# Patient Record
Sex: Male | Born: 1970 | Race: White | Hispanic: No | Marital: Married | Smoking: Former smoker
Health system: Southern US, Community
[De-identification: ages and names within clinical notes are randomized; demographics above are authoritative.]

## PROBLEM LIST (undated history)

## (undated) DIAGNOSIS — C189 Malignant neoplasm of colon, unspecified: Secondary | ICD-10-CM

## (undated) HISTORY — PX: HERNIA REPAIR: SHX51

## (undated) HISTORY — PX: COLON RESECTION: SHX5231

## (undated) HISTORY — PX: ANKLE SURGERY: SHX546

## (undated) HISTORY — PX: APPENDECTOMY: SHX54

## (undated) HISTORY — PX: WRIST FUSION: SHX839

---

## 2020-09-14 ENCOUNTER — Encounter (HOSPITAL_BASED_OUTPATIENT_CLINIC_OR_DEPARTMENT_OTHER): Payer: Self-pay | Admitting: *Deleted

## 2020-09-14 ENCOUNTER — Other Ambulatory Visit: Payer: Self-pay

## 2020-09-14 ENCOUNTER — Emergency Department (HOSPITAL_BASED_OUTPATIENT_CLINIC_OR_DEPARTMENT_OTHER): Payer: BC Managed Care – PPO

## 2020-09-14 ENCOUNTER — Emergency Department (HOSPITAL_BASED_OUTPATIENT_CLINIC_OR_DEPARTMENT_OTHER)
Admission: EM | Admit: 2020-09-14 | Discharge: 2020-09-14 | Disposition: A | Discharge status: Home / Self Care | Payer: BC Managed Care – PPO | Attending: Emergency Medicine | Admitting: Emergency Medicine

## 2020-09-14 DIAGNOSIS — Z87891 Personal history of nicotine dependence: Secondary | ICD-10-CM | POA: Insufficient documentation

## 2020-09-14 DIAGNOSIS — J1282 Pneumonia due to coronavirus disease 2019: Secondary | ICD-10-CM | POA: Diagnosis not present

## 2020-09-14 DIAGNOSIS — Z85038 Personal history of other malignant neoplasm of large intestine: Secondary | ICD-10-CM | POA: Insufficient documentation

## 2020-09-14 DIAGNOSIS — U071 COVID-19: Secondary | ICD-10-CM | POA: Diagnosis not present

## 2020-09-14 DIAGNOSIS — R05 Cough: Secondary | ICD-10-CM | POA: Diagnosis present

## 2020-09-14 HISTORY — DX: Malignant neoplasm of colon, unspecified: C18.9

## 2020-09-14 MED ORDER — ALBUTEROL SULFATE HFA 108 (90 BASE) MCG/ACT IN AERS: 1.0000 | INHALATION_SPRAY | Freq: Four times a day (QID) | RESPIRATORY_TRACT | 0 refills | Status: AC | PRN

## 2020-09-14 MED ORDER — DOXYCYCLINE HYCLATE 100 MG PO CAPS: 100.0000 mg | ORAL_CAPSULE | Freq: Two times a day (BID) | ORAL | 0 refills | Status: AC

## 2020-09-14 NOTE — ED Triage Notes (Signed)
C/o cough x 3 days , covid symptoms x 1 week

## 2020-09-14 NOTE — Discharge Instructions (Signed)
As we discussed, I have referred you to the monoclonal antibody infusion clinic.  If you are eligible, they will contact you and discuss possibilities.  I have provided you an albuterol inhaler that she can use.  As we discussed, there is a small area of pneumonia on the left side of the chest.  This could be related to Covid.  It may not respond to antibiotics.  I have prescribed you an antibiotic that you can use if you do not have any improvement in the next few days.  I have also provided you referral to Covid care clinic that you follow-up with.  Return emergency department for any fevers or any other worsening concerning symptoms.

## 2020-09-14 NOTE — ED Provider Notes (Signed)
[facility] EMERGENCY DEPARTMENT Provider Note   CSN: [id number] Arrival date & time: 09/14/20  1341     History Chief Complaint  Patient presents with   Cough    covid +    Jacob Wolfe is a 49 y.o. male with PMH/o Colon Cancer (remission) who presents for evaluation of persistent fever, cough.  He reports that he was diagnosed with Covid about 5 days ago.  He thinks he has been symptomatic for about 7 days.  He states he came today because he continues to spike a fever.  He reports that his fever spikes up as high as to 102.  He has been taking Tylenol ibuprofen but he is concerned because it keeps coming back he is continue to have cough.  Sometimes it is productive.  He does not feel like he has had trouble breathing.  He states he was concerned because his fever Spiking.  He does not smoke.  He denies any history of COPD or asthma.  He has history of colon cancer but states he is in remission. He is unvaccinated.   The history is provided by the patient.       Past Medical History:  Diagnosis Date   Colon cancer ([place])     There are no problems to display for this patient.   Past Surgical History:  Procedure Laterality Date   ANKLE SURGERY     APPENDECTOMY     COLON RESECTION     HERNIA REPAIR     WRIST FUSION         No family history on file.  Social History   Tobacco Use   Smoking status: Former Smoker  Substance Use Topics   Alcohol use: Not Currently   Drug use: Not Currently    Home Medications Prior to Admission medications   Medication Sig Start Date End Date Taking? Authorizing Provider  albuterol (VENTOLIN HFA) 108 (90 Base) MCG/ACT inhaler Inhale 1-2 puffs into the lungs every 6 (six) hours as needed for wheezing or shortness of breath. 09/14/20   [place]idence Lanius A, PA-C  doxycycline (VIBRAMYCIN) 100 MG capsule Take 1 capsule (100 mg total) by mouth 2 (two) times daily for 7 days. 09/14/20 09/21/20  Volanda Napoleon, PA-C     Allergies    Patient has no known allergies.  Review of Systems   Review of Systems  Constitutional: Positive for fever.  Respiratory: Positive for cough. Negative for shortness of breath.   Cardiovascular: Negative for chest pain.  Gastrointestinal: Negative for abdominal pain, diarrhea, nausea and vomiting.  All other systems reviewed and are negative.   Physical Exam Updated Vital Signs BP 128/90    Pulse (!) 108    Temp 99.5 F (37.5 C) (Oral)    Resp 18    Ht 6' (1.829 m)    Wt 109.8 kg    SpO2 98%    BMI 32.82 kg/m   Physical Exam Vitals and nursing note reviewed.  Constitutional:      Appearance: Normal appearance. He is well-developed.     Comments: NAD   HENT:     Head: Normocephalic and atraumatic.  Eyes:     General: Lids are normal.     Conjunctiva/sclera: Conjunctivae normal.     Pupils: Pupils are equal, round, and reactive to light.  Cardiovascular:     Rate and Rhythm: Normal rate and regular rhythm.     Pulses: Normal pulses.     Heart sounds: Normal heart  sounds. No murmur heard.  No friction rub. No gallop.   Pulmonary:     Effort: Pulmonary effort is normal.     Breath sounds: Normal breath sounds.     Comments: Lungs clear to auscultation bilaterally.  Symmetric chest rise.  No wheezing, rales, rhonchi. Abdominal:     Palpations: Abdomen is soft. Abdomen is not rigid.     Tenderness: There is no abdominal tenderness. There is no guarding.     Comments: Abdomen is soft, non-distended, non-tender. No rigidity, No guarding. No peritoneal signs.  Musculoskeletal:        General: Normal range of motion.     Cervical back: Full passive range of motion without pain.  Skin:    General: Skin is warm and dry.     Capillary Refill: Capillary refill takes less than 2 seconds.  Neurological:     Mental Status: He is alert and oriented to person, place, and time.  Psychiatric:        Speech: Speech normal.     ED Results / Procedures / Treatments    Labs (all labs ordered are listed, but only abnormal results are displayed) Labs Reviewed - No data to display  EKG None  Radiology DG Chest Portable 1 View  Result Date: 09/14/2020 CLINICAL DATA:  Cough, COVID positive EXAM: PORTABLE CHEST 1 VIEW COMPARISON:  None. FINDINGS: Subtle ill-defined airspace opacity overlying the left mid lung. Otherwise, the lungs are clear. Cardiac and mediastinal contours are within normal limits. No pleural effusion or pneumothorax. No acute osseous abnormality. IMPRESSION: Subtle, ill-defined airspace opacity overlying the left mid lung which may represent a region of focal infection/inflammation. Atypical/viral pneumonia is a possibility given known COVID positivity. Electronically Signed   By: Jacqulynn Cadet M.D.   On: 09/14/2020 14:27    Procedures Procedures (including critical care time)  Medications Ordered in ED Medications - No data to display  ED Course  I have reviewed the triage vital signs and the nursing notes.  Pertinent labs & imaging results that were available during my care of the patient were reviewed by me and considered in my medical decision making (see chart for details).    MDM Rules/Calculators/A&P                          49 year old male who was diagnosed with Covid a week ago presents for evaluation of persistent cystic fever and cough.  He is unvaccinated.  He states that his fever has continued and will sometimes get up to 102.  He has been taking Tylenol and ibuprofen.  He states that he continues to have a cough.  Sometimes it is productive.  He does not feel like he is having difficulty breathing.  He used to smoke but does not smoke anymore.  On initially arrival, he is afebrile, nontoxic-appearing.  Slightly tachycardic but is not tachycardic on my exam.  No evidence of respiratory distress.  Chest x-ray ordered at triage.  Chest x-ray shows possible subtle ill-defined airspace opacity overlying the left midlung  which could represent focal infection/inflammation.  I discussed results with patient.  I discussed with him that this could be pneumonia from Covid which would be viral in nature.  He is very concerned because he continues to have persistent fever and is concerned about being treated for possible pneumonia.  I discussed with him that given the viral nature of this, it may not respond to antibiotics.  After engaging  in shared decision making and extensive discussion, we agreed to give him a prescription for doxycycline that he can wait a few days and if he has no improvement can start.  Additionally, I have referred patient to monoclonal antibody infusion clinic. At this time, patient exhibits no emergent life-threatening condition that require further evaluation in ED. Patient had ample opportunity for questions and discussion. All patient's questions were answered with full understanding. Strict return precautions discussed. Patient expresses understanding and agreement to plan.   Jacob Wolfe was evaluated in Emergency Department on 09/14/2020 for the symptoms described in the history of present illness. He was evaluated in the context of the global COVID-19 pandemic, which necessitated consideration that the patient might be at risk for infection with the SARS-CoV-2 virus that causes COVID-19. Institutional protocols and algorithms that pertain to the evaluation of patients at risk for COVID-19 are in a state of rapid change based on information released by regulatory bodies including the CDC and federal and state organizations. These policies and algorithms were followed during the patient's care in the ED.  Portions of this note were generated with [employer]. Dictation errors may occur despite best attempts at proofreading.  Final Clinical Impression(s) / ED Diagnoses Final diagnoses:  COVID-19  Pneumonia due to COVID-19 virus    Rx / DC Orders ED Discharge Orders          Ordered    doxycycline (VIBRAMYCIN) 100 MG capsule  2 times daily        09/14/20 1528    albuterol (VENTOLIN HFA) 108 (90 Base) MCG/ACT inhaler  Every 6 hours PRN        09/14/20 1528           Desma Mcgregor 09/14/20 1623    Drenda Freeze, MD 09/14/20 [phone]

## 2020-09-15 ENCOUNTER — Ambulatory Visit (HOSPITAL_COMMUNITY)
Admission: RE | Admit: 2020-09-15 | Discharge: 2020-09-15 | Disposition: A | Discharge status: Home / Self Care | Payer: BC Managed Care – PPO | Attending: Family Medicine | Admitting: Family Medicine

## 2020-09-15 ENCOUNTER — Other Ambulatory Visit: Payer: Self-pay | Admitting: Internal Medicine

## 2020-09-15 DIAGNOSIS — U071 COVID-19: Secondary | ICD-10-CM | POA: Insufficient documentation

## 2020-09-15 DIAGNOSIS — Z6832 Body mass index (BMI) 32.0-32.9, adult: Secondary | ICD-10-CM

## 2020-09-15 MED ORDER — ALBUTEROL SULFATE HFA 108 (90 BASE) MCG/ACT IN AERS: 2.0000 | INHALATION_SPRAY | Freq: Once | RESPIRATORY_TRACT | Status: DC | PRN

## 2020-09-15 MED ORDER — DIPHENHYDRAMINE HCL 50 MG/ML IJ SOLN: 50.0000 mg | Freq: Once | INTRAMUSCULAR | Status: DC | PRN

## 2020-09-15 MED ORDER — EPINEPHRINE 0.3 MG/0.3ML IJ SOAJ: 0.3000 mg | Freq: Once | INTRAMUSCULAR | Status: DC | PRN

## 2020-09-15 MED ORDER — SODIUM CHLORIDE 0.9 % IV SOLN
1200.0000 mg | Freq: Once | INTRAVENOUS | Status: AC
  Administered 2020-09-15: 1200 mg via INTRAVENOUS

## 2020-09-15 MED ORDER — FAMOTIDINE IN NACL 20-0.9 MG/50ML-% IV SOLN: 20.0000 mg | Freq: Once | INTRAVENOUS | Status: DC | PRN

## 2020-09-15 MED ORDER — SODIUM CHLORIDE 0.9 % IV SOLN: INTRAVENOUS | Status: DC | PRN

## 2020-09-15 MED ORDER — METHYLPREDNISOLONE SODIUM SUCC 125 MG IJ SOLR: 125.0000 mg | Freq: Once | INTRAMUSCULAR | Status: DC | PRN

## 2020-09-15 NOTE — Progress Notes (Signed)
  Diagnosis: COVID-19  Physician: Asencion Noble, MD  Procedure: Covid Infusion Clinic Med: casirivimab\imdevimab infusion - Provided patient with casirivimab\imdevimab fact sheet for patients, parents and caregivers prior to infusion.  Complications: No immediate complications noted.  Discharge: Discharged home   Jacob Wolfe 09/15/2020

## 2020-09-15 NOTE — Progress Notes (Signed)
I connected by phone with Jacob Wolfe on 09/15/2020 at 9:38 AM to discuss the potential use of a new treatment for mild to moderate COVID-19 viral infection in non-hospitalized patients.  This patient is a 49 y.o. male that meets the FDA criteria for Emergency Use Authorization of COVID monoclonal antibody casirivimab/imdevimab or bamlanivimab/eteseviamb.  Has a (+) direct SARS-CoV-2 viral test result  Has mild or moderate COVID-19   Is NOT hospitalized due to COVID-19  Is within 10 days of symptom onset  Has at least one of the high risk factor(s) for progression to severe COVID-19 and/or hospitalization as defined in EUA.  Specific high risk criteria : BMI > 25   I have spoken and communicated the following to the patient or parent/caregiver regarding COVID monoclonal antibody treatment:  1. FDA has authorized the emergency use for the treatment of mild to moderate COVID-19 in adults and pediatric patients with positive results of direct SARS-CoV-2 viral testing who are 46 years of age and older weighing at least 40 kg, and who are at high risk for progressing to severe COVID-19 and/or hospitalization.  2. The significant known and potential risks and benefits of COVID monoclonal antibody, and the extent to which such potential risks and benefits are unknown.  3. Information on available alternative treatments and the risks and benefits of those alternatives, including clinical trials.  4. Patients treated with COVID monoclonal antibody should continue to self-isolate and use infection control measures (e.g., wear mask, isolate, social distance, avoid sharing personal items, clean and disinfect "high touch" surfaces, and frequent handwashing) according to CDC guidelines.   5. The patient or parent/caregiver has the option to accept or refuse COVID monoclonal antibody treatment.  After reviewing this information with the patient, the patient has agreed to receive one of the available  covid 19 monoclonal antibodies and will be provided an appropriate fact sheet prior to infusion.  Alan Ripper, [place]

## 2020-09-15 NOTE — Discharge Instructions (Signed)

## 2022-04-10 ENCOUNTER — Encounter (HOSPITAL_BASED_OUTPATIENT_CLINIC_OR_DEPARTMENT_OTHER): Payer: Self-pay | Admitting: Emergency Medicine

## 2022-04-10 ENCOUNTER — Emergency Department (HOSPITAL_BASED_OUTPATIENT_CLINIC_OR_DEPARTMENT_OTHER): Payer: BC Managed Care – PPO

## 2022-04-10 ENCOUNTER — Emergency Department (HOSPITAL_BASED_OUTPATIENT_CLINIC_OR_DEPARTMENT_OTHER)
Admission: EM | Admit: 2022-04-10 | Discharge: 2022-04-10 | Disposition: A | Discharge status: Home / Self Care | Payer: BC Managed Care – PPO | Attending: Emergency Medicine | Admitting: Emergency Medicine

## 2022-04-10 ENCOUNTER — Other Ambulatory Visit: Payer: Self-pay

## 2022-04-10 DIAGNOSIS — M79671 Pain in right foot: Secondary | ICD-10-CM | POA: Diagnosis not present

## 2022-04-10 DIAGNOSIS — M25571 Pain in right ankle and joints of right foot: Secondary | ICD-10-CM | POA: Diagnosis present

## 2022-04-10 MED ORDER — CYCLOBENZAPRINE HCL 10 MG PO TABS: 10.0000 mg | ORAL_TABLET | Freq: Two times a day (BID) | ORAL | 0 refills | Status: AC | PRN

## 2022-04-10 NOTE — ED Notes (Signed)
Pt care taken, complaining of pain in the rt leg. ?

## 2022-04-10 NOTE — ED Provider Notes (Signed)
?[facility] EMERGENCY DEPARTMENT ?Provider Note ? ? ?CSN: [id number] ?Arrival date & time: 04/10/22  1901 ? ?  ? ?History ? ?Chief Complaint  ?Patient presents with  ? Ankle Pain  ? ? ?Jacob Wolfe is a 51 y.o. male. ? ?The history is provided by the patient and medical records. No language interpreter was used.  ?Ankle Pain ?Location:  Ankle ?Time since incident:  3 weeks ?Injury: no   ?Ankle location:  R ankle ?Pain details:  ?  Quality:  Aching ?  Radiates to: r calf. ?  Severity:  Severe ?  Onset quality:  Gradual ?  Timing:  Constant ?  Progression:  Waxing and waning ?Chronicity:  New ?Foreign body present:  No foreign bodies ?Tetanus status:  Unknown ?Prior injury to area:  Yes ?Relieved by:  Nothing ?Worsened by:  Bearing weight ?Ineffective treatments:  None tried ?Associated symptoms: swelling (subjhective per pt)   ?Associated symptoms: no back pain, no decreased ROM, no fatigue, no fever, no itching, no muscle weakness, no neck pain, no numbness, no stiffness and no tingling   ? ?  ? ?Home Medications ?Prior to Admission medications   ?Medication Sig Start Date End Date Taking? Authorizing Provider  ?albuterol (VENTOLIN HFA) 108 (90 Base) MCG/ACT inhaler Inhale 1-2 puffs into the lungs every 6 (six) hours as needed for wheezing or shortness of breath. 09/14/20   Volanda Napoleon, PA-C  ?   ? ?Allergies    ?Patient has no known allergies.   ? ?Review of Systems   ?Review of Systems  ?Constitutional:  Negative for chills, fatigue and fever.  ?HENT:  Negative for congestion.   ?Eyes:  Negative for visual disturbance.  ?Respiratory:  Negative for cough, chest tightness, shortness of breath and wheezing.   ?Cardiovascular:  Negative for chest pain, palpitations and leg swelling.  ?Gastrointestinal:  Negative for abdominal pain, constipation, diarrhea, nausea and vomiting.  ?Genitourinary:  Negative for flank pain and frequency.  ?Musculoskeletal:  Negative for back pain, neck pain, neck stiffness  and stiffness.  ?Skin:  Negative for itching, rash and wound.  ?Neurological:  Negative for headaches.  ?Psychiatric/Behavioral:  Negative for agitation and confusion.   ?All other systems reviewed and are negative. ? ?Physical Exam ?Updated Vital Signs ?BP 126/90   Pulse 79   Temp 97.7 ?F (36.5 ?C) (Oral)   Resp 16   Wt 108.9 kg   SpO2 98%   BMI 32.55 kg/m?  ?Physical Exam ?Vitals and nursing note reviewed.  ?Constitutional:   ?   General: He is not in acute distress. ?   Appearance: He is well-developed. He is not ill-appearing, toxic-appearing or diaphoretic.  ?HENT:  ?   Head: Normocephalic and atraumatic.  ?   Mouth/Throat:  ?   Mouth: Mucous membranes are moist.  ?Eyes:  ?   Conjunctiva/sclera: Conjunctivae normal.  ?Cardiovascular:  ?   Rate and Rhythm: Normal rate and regular rhythm.  ?   Heart sounds: No murmur heard. ?Pulmonary:  ?   Effort: Pulmonary effort is normal. No respiratory distress.  ?   Breath sounds: Normal breath sounds. No wheezing, rhonchi or rales.  ?Chest:  ?   Chest wall: No tenderness.  ?Abdominal:  ?   General: Abdomen is flat.  ?   Palpations: Abdomen is soft.  ?   Tenderness: There is no abdominal tenderness. There is no guarding or rebound.  ?Musculoskeletal:     ?   General: Tenderness present. No swelling or  signs of injury.  ?   Cervical back: Neck supple.  ?   Right lower leg: No edema.  ?   Left lower leg: No edema.  ?   Right ankle: No swelling, deformity or lacerations. Tenderness present. Anterior drawer test negative. Normal pulse.  ?   Right Achilles Tendon: Tenderness present.  ?   Comments: Surgical scar on left ankle.  Tenderness across the ankle without significant swelling, erythema, induration, crepitance, or skin changes  ?Skin: ?   General: Skin is warm and dry.  ?   Capillary Refill: Capillary refill takes less than 2 seconds.  ?   Findings: No erythema or rash.  ?Neurological:  ?   General: No focal deficit present.  ?   Mental Status: He is alert. Mental  status is at baseline.  ?   Sensory: No sensory deficit.  ?   Motor: No weakness.  ?Psychiatric:     ?   Mood and Affect: Mood normal.  ? ? ?ED Results / Procedures / Treatments   ?Labs ?(all labs ordered are listed, but only abnormal results are displayed) ?Labs Reviewed - No data to display ? ?EKG ?None ? ?Radiology ?DG Ankle Complete Right ? ?Result Date: 04/10/2022 ?CLINICAL DATA:  Right ankle pain EXAM: RIGHT ANKLE - COMPLETE 3+ VIEW COMPARISON:  None. FINDINGS: Postoperative changes in the calcaneus. Plantar and posterior calcaneal spurs. Rounded well corticated bone fragments adjacent to the medial and lateral malleoli, likely related to old injury. No acute fracture, subluxation or dislocation. Degenerative changes in the ankle. Soft tissues are intact. IMPRESSION: No acute bony abnormality. Electronically Signed   By: Rolm Baptise M.D.   On: 04/10/2022 19:47  ? ?US Venous Img Lower Unilateral Right ? ?Result Date: 04/10/2022 ?CLINICAL DATA:  Right leg pain EXAM: RIGHT LOWER EXTREMITY VENOUS DOPPLER ULTRASOUND TECHNIQUE: Gray-scale sonography with compression, as well as color and duplex ultrasound, were performed to evaluate the deep venous system(s) from the level of the common femoral vein through the popliteal and proximal calf veins. COMPARISON:  None. FINDINGS: VENOUS Normal compressibility of the common femoral, superficial femoral, and popliteal veins, as well as the visualized calf veins. Visualized portions of profunda femoral vein and great saphenous vein unremarkable. No filling defects to suggest DVT on grayscale or color Doppler imaging. Doppler waveforms show normal direction of venous flow, normal respiratory plasticity and response to augmentation. Limited views of the contralateral common femoral vein are unremarkable. OTHER None. Limitations: none IMPRESSION: Negative. Electronically Signed   By: Rolm Baptise M.D.   On: 04/10/2022 21:07   ? ?Procedures ?Procedures  ? ? ?Medications Ordered  in ED ?Medications - No data to display ? ?ED Course/ Medical Decision Making/ A&P ?  ?                        ?Medical Decision Making ?Amount and/or Complexity of Data Reviewed ?Radiology: ordered. ? ?Risk ?Prescription drug management. ? ? ? ?Jacob Wolfe is a 51 y.o. male with a past medical history significant for previous colon cancer status post colon resection, previous appendectomy, and previous right ankle surgery who presents with right ankle pain.  According to patient, for the last few weeks he has had right ankle pain with some swelling going into his calf.  It hurts to walk on it.  He does not remember any focal trauma but he is concerned about the pain.  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  He denies any new numbness or weakness.  He denies any erythema or redness.  He denies any skin changes or rashes.  He denies any known trauma.  He denies any pain in his knee, hip, or other complaints.  Specifically denies any fevers, chills, chest pain, shortness of breath, nausea, vomiting, constipation, diarrhea, or urinary changes.  Pain is only in the right side. ? ?On exam, lungs clear and chest nontender.  Abdomen nontender.  Hip and knee nontender.  Patient does have tenderness in the lateral right ankle as well as his lower posterior calf area near the Achilles.  Intact strength in the ankle joint and foot.  He had unchanged sensation from his reported baseline neuropathy.  He had normal capillary refill.  Normal DP and PT pulse.  No rashes or skin changes seen.  Exam otherwise unremarkable. ? ?Had a shared decision made conversation with patient.  We got an x-ray that showed no acute fracture or dislocation.  No evidence of subcutaneous gas or effusion.  He does have some arthritis and previous surgical changes.  No soft tissue abnormality reported.  Due to the pain going into the calf near the Achilles, we will get an ultrasound  which showed no evidence of DVT.  He had good pulses that were symmetric so low suspicion for an arterial cause of symptoms at this time.  Given his lack of skin changes, fevers, or other infectious appearing c

## 2022-04-10 NOTE — ED Triage Notes (Signed)
Right ankle pain  x 3 weeks. Denies injury. Hx of previous injury on same ankle.  ?

## 2022-04-10 NOTE — ED Notes (Signed)
Pt said he has a boot at home, refused the one here ?

## 2022-04-10 NOTE — Discharge Instructions (Signed)
Your history, exam and work-up today were overall reassuring and there was no evidence of blood clot, blood flow abnormality, or acute bony injury causing your symptoms.  I do suspect that your combination of neuropathy, previous surgery, and arthritis contributed to your ankle pain today.  The x-ray did not show evidence of acute fracture however we did discuss possible occult ligamentous or tendinous injury contribute your symptoms.  We do feel you need to follow-up with outpatient orthopedics versus sports medicine for continued pain and please use the immobilizer boot for your ankle.  Please use the muscle medicine as the pain was going towards your calf muscles and please rest and stay hydrated.  If any symptoms change or worsen acutely, please return to the nearest emergency department. ?

## 2022-04-10 NOTE — ED Notes (Signed)
Pt refused cam walker, states he has one at home.RN York Cerise informed ?

## 2023-12-20 IMAGING — DX DG ANKLE COMPLETE 3+V*R*
3 series · 3 of 3 positions shown · non-contrast
Comparison: None.

CLINICAL DATA: Right ankle pain

EXAM:
RIGHT ANKLE - COMPLETE 3+ VIEW

[ankle ap]
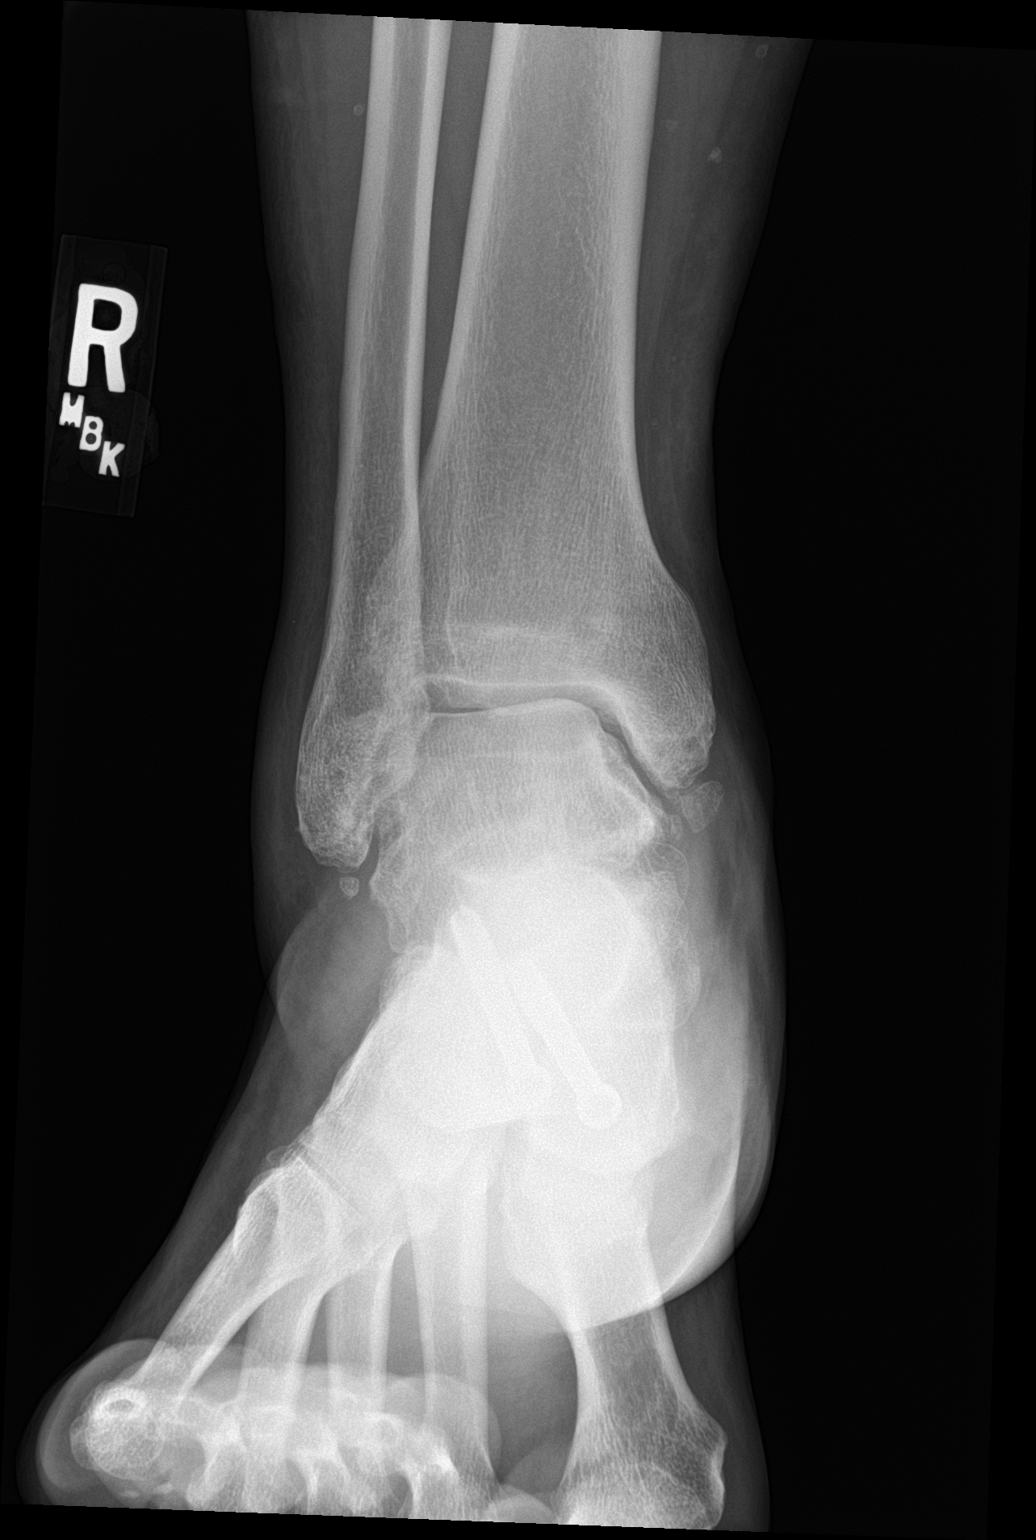

[ankle obl]
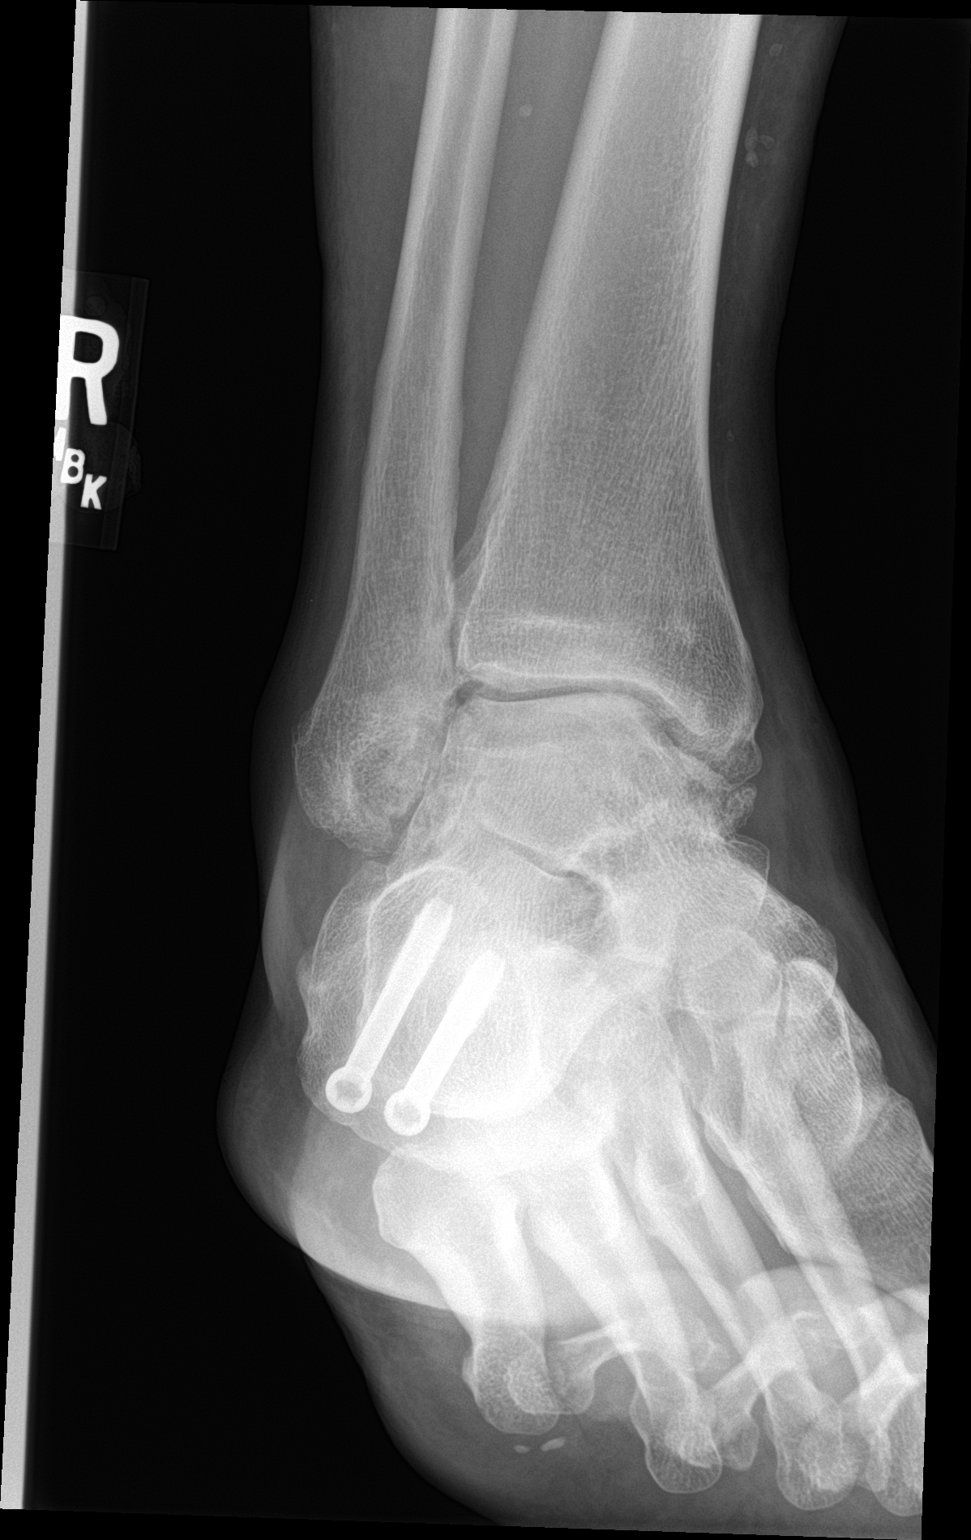

[ankle lat]
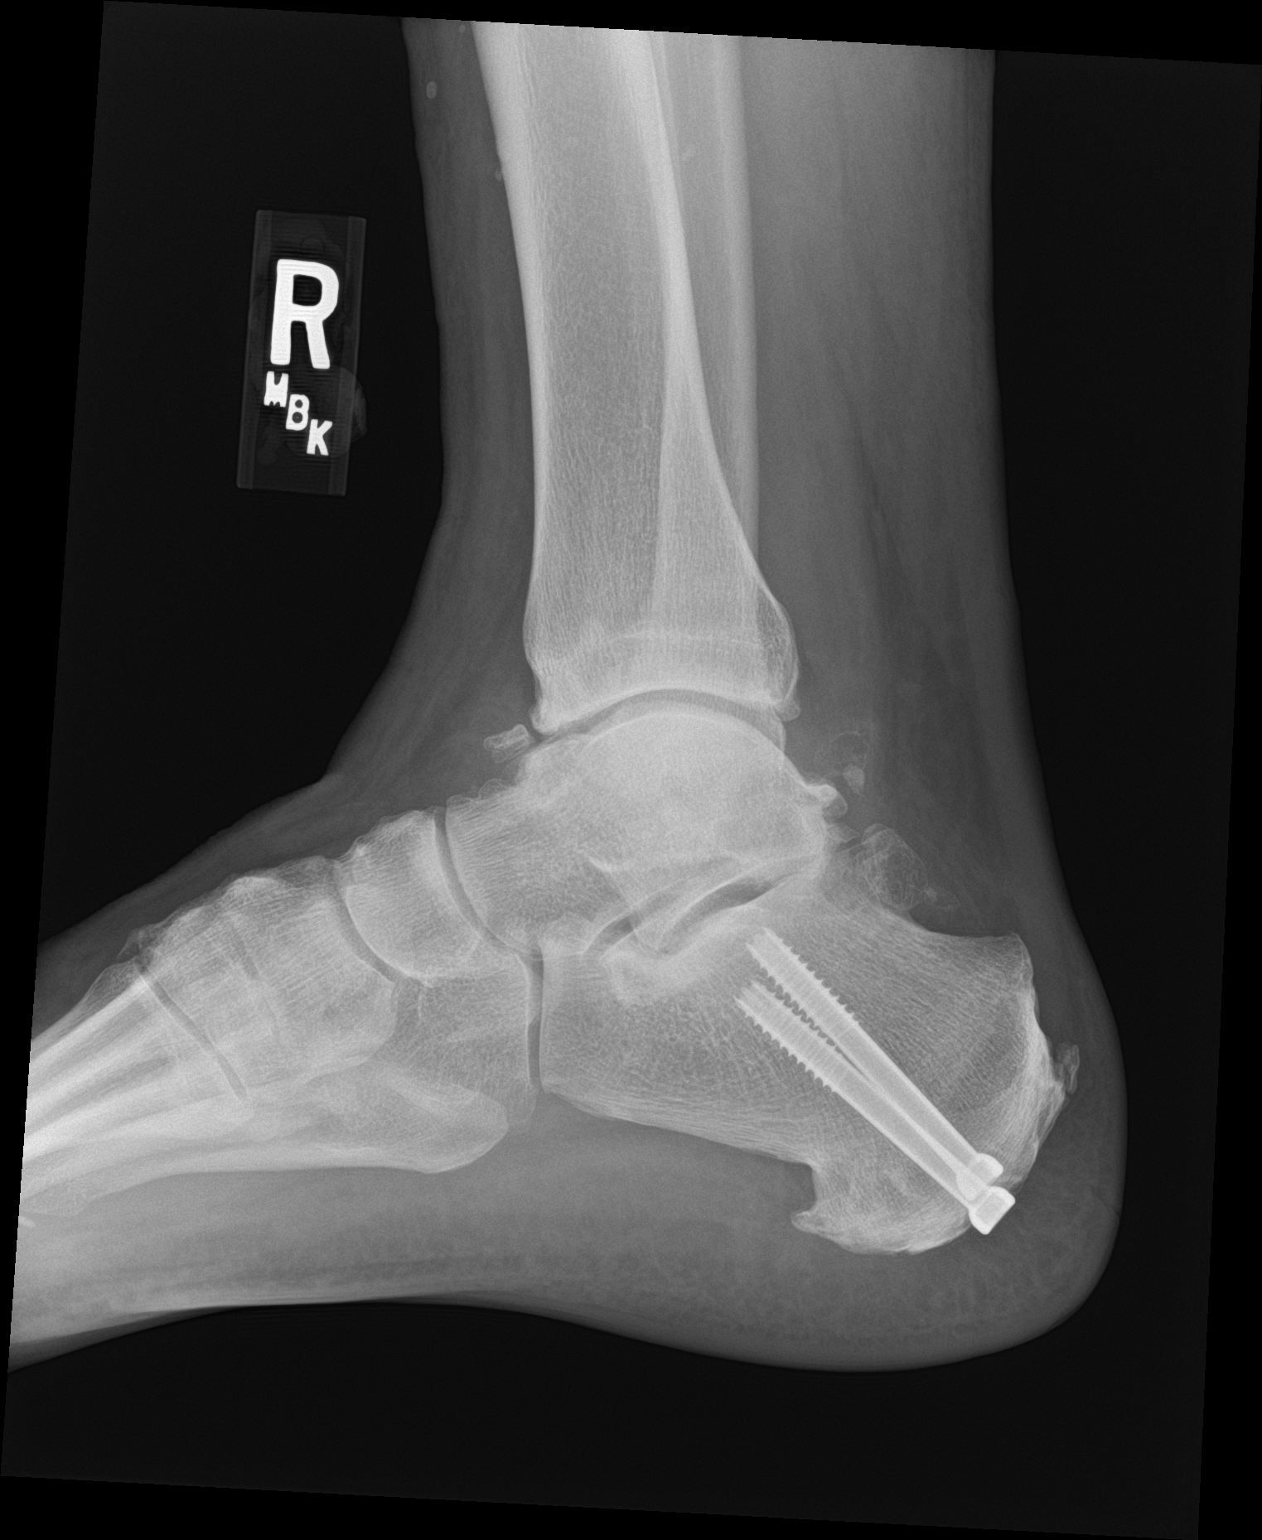

[3 of 3 positions shown; findings below may reference images not displayed]

FINDINGS: Postoperative changes in the calcaneus. Plantar and posterior
calcaneal spurs. Rounded well corticated bone fragments adjacent to
the medial and lateral malleoli, likely related to old injury. No
acute fracture, subluxation or dislocation. Degenerative changes in
the ankle. Soft tissues are intact.
IMPRESSION: No acute bony abnormality.

## 2023-12-20 IMAGING — US US EXTREM LOW VENOUS*R*
1 series · 14 of 24 positions shown · non-contrast
Comparison: None.

CLINICAL DATA: Right leg pain

EXAM:
RIGHT LOWER EXTREMITY VENOUS DOPPLER ULTRASOUND
TECHNIQUE: Gray-scale sonography with compression, as well as color and duplex
ultrasound, were performed to evaluate the deep venous system(s)
from the level of the common femoral vein through the popliteal and
proximal calf veins.

[Series 1: us extrem low venous*right* · 14 of 30 slices shown]
[im 1/30]
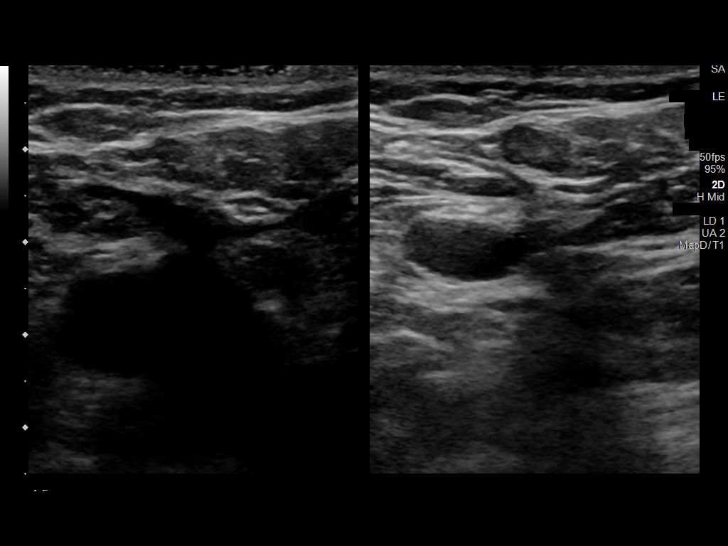
[im 3/30]
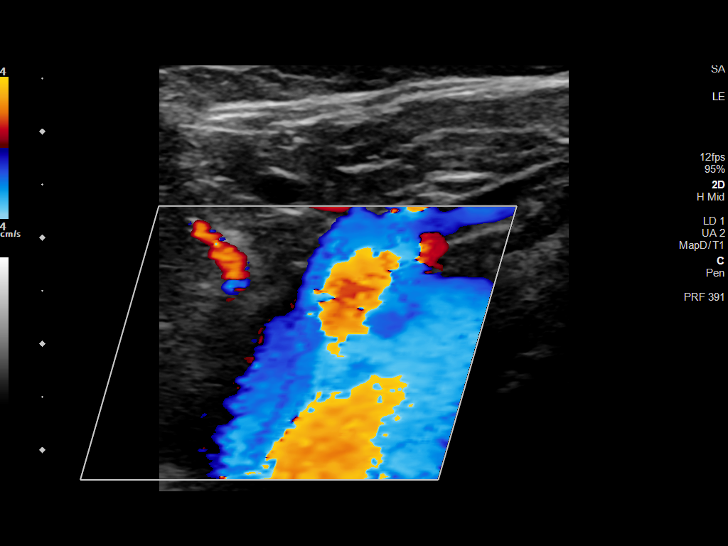
[im 6/30]
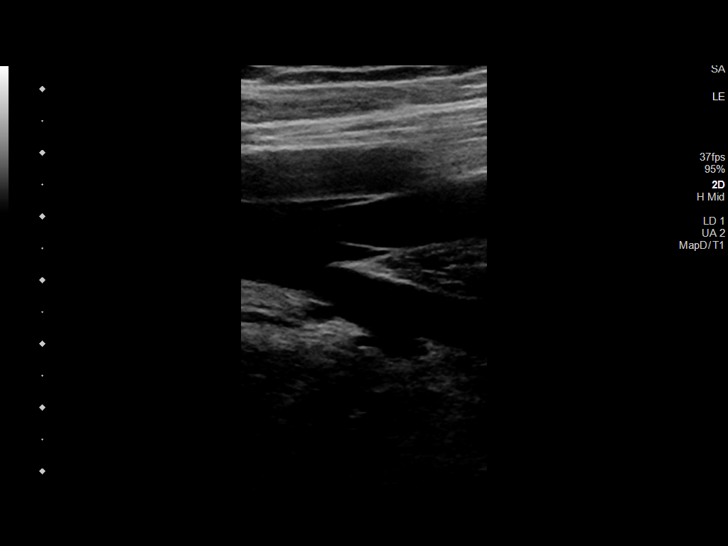
[im 8/30]
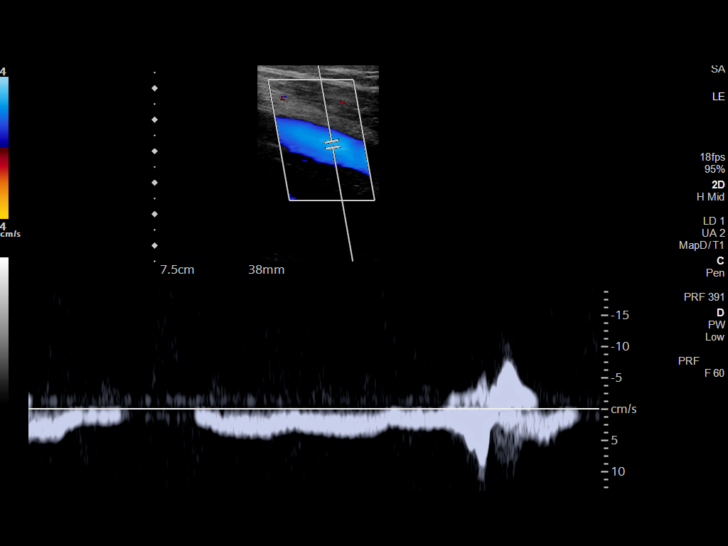
[im 9/30]
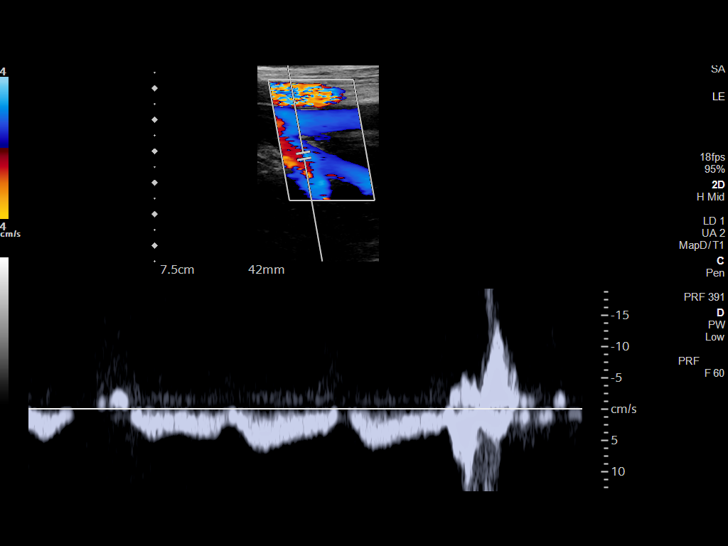
[im 12/30]
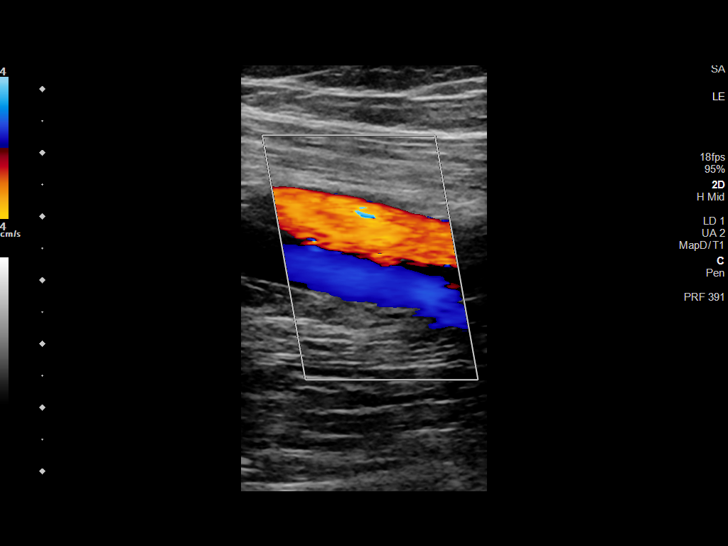
[im 14/30]
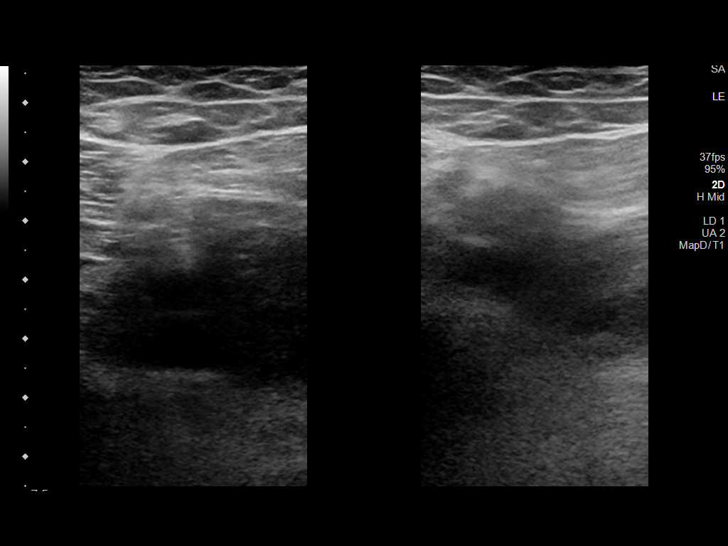
[im 16/30]
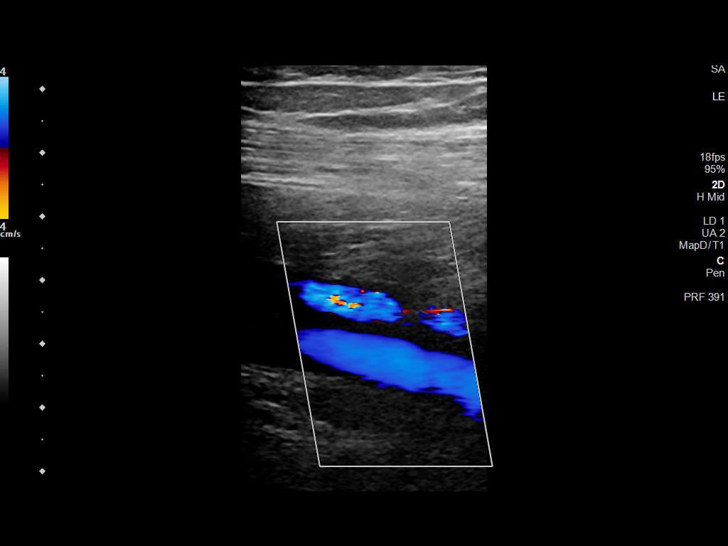
[im 18/30]
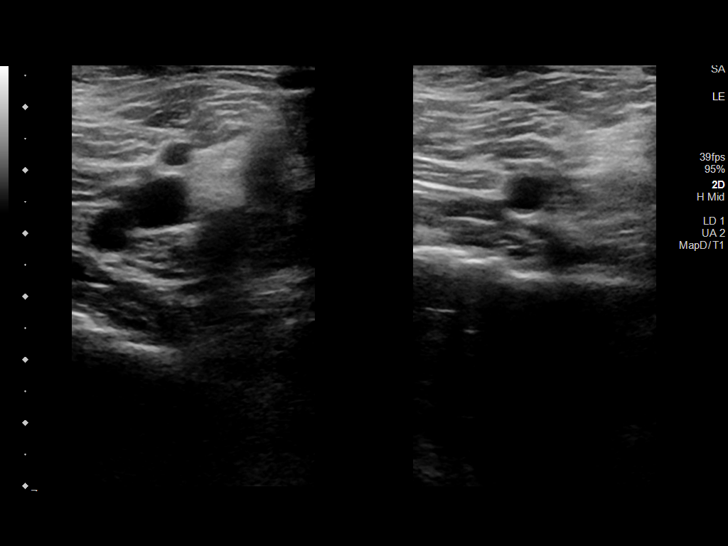
[im 21/30]
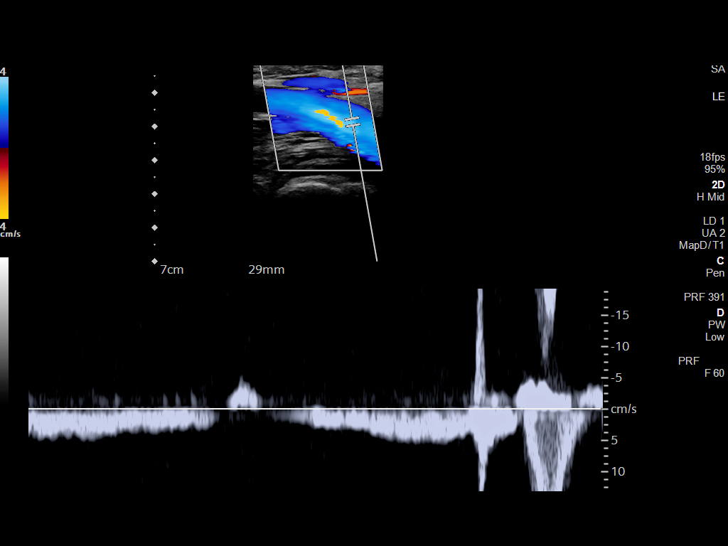
[im 23/30]
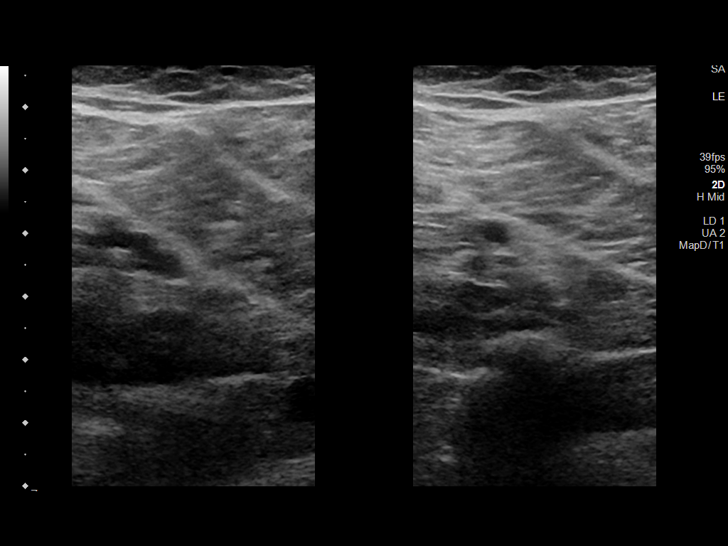
[im 24/30]
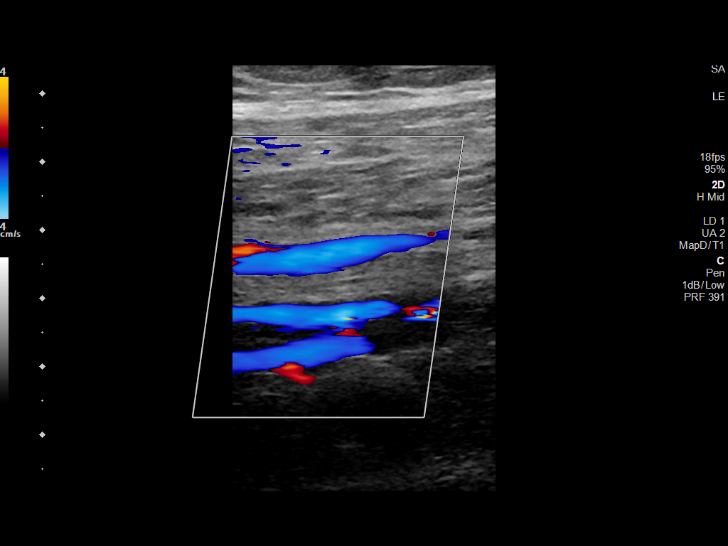
[im 27/30]
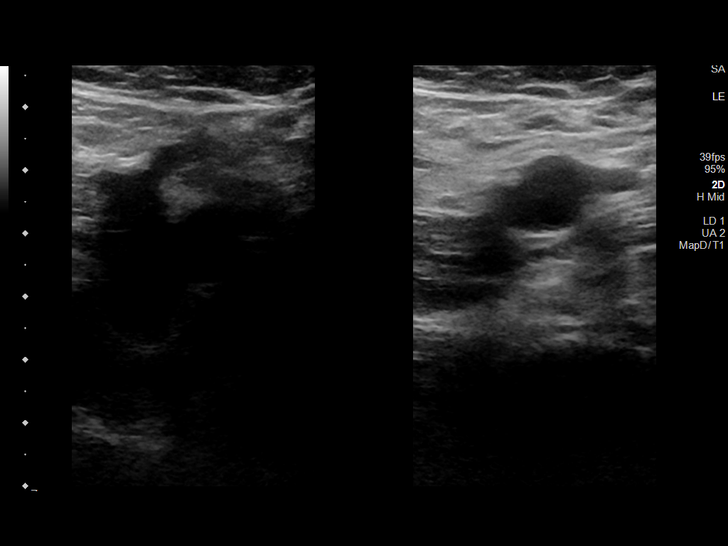
[im 30/30]
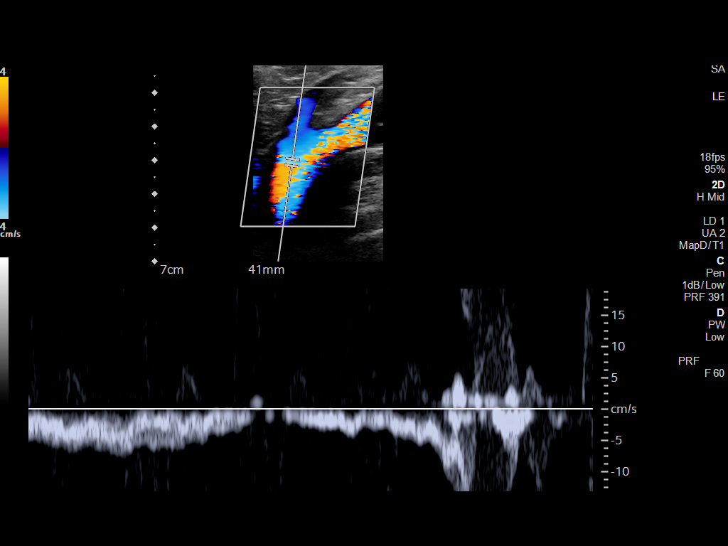

[14 of 24 positions shown; findings below may reference images not displayed]

FINDINGS: VENOUS

Normal compressibility of the common femoral, superficial femoral,
and popliteal veins, as well as the visualized calf veins.
Visualized portions of profunda femoral vein and great saphenous
vein unremarkable. No filling defects to suggest DVT on grayscale or
color Doppler imaging. Doppler waveforms show normal direction of
venous flow, normal respiratory plasticity and response to
augmentation.

Limited views of the contralateral common femoral vein are
unremarkable.

OTHER

None.

Limitations: none
IMPRESSION: Negative.
